# Patient Record
Sex: Female | Born: 1945 | Race: White | Hispanic: No | Marital: Married | Smoking: Never smoker
Health system: Southern US, Community
[De-identification: ages and names within clinical notes are randomized; demographics above are authoritative.]

---

## 2000-05-28 ENCOUNTER — Other Ambulatory Visit: Admission: RE | Admit: 2000-05-28 | Discharge: 2000-05-28 | Payer: Self-pay | Admitting: Obstetrics & Gynecology

## 2000-07-28 ENCOUNTER — Ambulatory Visit (HOSPITAL_COMMUNITY): Admission: RE | Admit: 2000-07-28 | Discharge: 2000-07-28 | Payer: Self-pay | Admitting: Obstetrics & Gynecology

## 2000-08-17 ENCOUNTER — Ambulatory Visit: Admission: RE | Admit: 2000-08-17 | Discharge: 2000-08-17 | Payer: Self-pay | Admitting: Gynecology

## 2000-08-19 ENCOUNTER — Encounter: Payer: Self-pay | Admitting: Obstetrics & Gynecology

## 2000-08-24 ENCOUNTER — Inpatient Hospital Stay (HOSPITAL_COMMUNITY): Admission: RE | Admit: 2000-08-24 | Discharge: 2000-08-27 | Payer: Self-pay | Admitting: Obstetrics & Gynecology

## 2000-09-13 ENCOUNTER — Encounter: Payer: Self-pay | Admitting: Obstetrics & Gynecology

## 2000-09-13 ENCOUNTER — Inpatient Hospital Stay (HOSPITAL_COMMUNITY): Admission: EM | Admit: 2000-09-13 | Discharge: 2000-09-15 | Payer: Self-pay | Admitting: Obstetrics & Gynecology

## 2001-05-06 ENCOUNTER — Other Ambulatory Visit: Admission: RE | Admit: 2001-05-06 | Discharge: 2001-05-06 | Payer: Self-pay | Admitting: Obstetrics & Gynecology

## 2002-05-24 ENCOUNTER — Other Ambulatory Visit: Admission: RE | Admit: 2002-05-24 | Discharge: 2002-05-24 | Payer: Self-pay | Admitting: Obstetrics & Gynecology

## 2003-05-29 ENCOUNTER — Other Ambulatory Visit: Admission: RE | Admit: 2003-05-29 | Discharge: 2003-05-29 | Payer: Self-pay | Admitting: Obstetrics & Gynecology

## 2004-06-03 ENCOUNTER — Ambulatory Visit (HOSPITAL_COMMUNITY): Admission: RE | Admit: 2004-06-03 | Discharge: 2004-06-03 | Payer: Self-pay | Admitting: Dermatology

## 2004-06-13 ENCOUNTER — Other Ambulatory Visit: Admission: RE | Admit: 2004-06-13 | Discharge: 2004-06-13 | Payer: Self-pay | Admitting: Obstetrics & Gynecology

## 2004-07-11 ENCOUNTER — Encounter: Admission: RE | Admit: 2004-07-11 | Discharge: 2004-07-11 | Payer: Self-pay | Admitting: Obstetrics & Gynecology

## 2004-10-13 ENCOUNTER — Ambulatory Visit: Payer: Self-pay

## 2005-07-30 ENCOUNTER — Other Ambulatory Visit: Admission: RE | Admit: 2005-07-30 | Discharge: 2005-07-30 | Payer: Self-pay | Admitting: Obstetrics & Gynecology

## 2005-12-07 IMAGING — CR DG BONE SURVEY MET
8 of 10 series · 8 of 10 positions shown · non-contrast
Comparison: none

CLINICAL DATA: History of Histiocytosis X. Questionable lesions.  
BONE SURVEY:
AP pelvis demonstrates no lytic or obstructive lesions, and no evidence for pathologic fracture.
AP and lateral views of the thoracic and lumbar spine demonstrate thoracolumbar scoliosis with convexity of the scoliosis to the right in the lower thoracic region. There are no fractures or bone lesions.  There are moderate osteophytic changes.
AP and lateral views of the cervical spine demonstrate degenerative disk space narrowing at C5-6 and C6-7 levels.  There are no lytic lesions and there are no fractures or subluxations.  
AP views of the shoulders demonstrate no lytic foci.  There are mild degenerative changes associated with the AC joints bilaterally.  AP views of the right and left femurs and right and left humeri demonstrate no lytic lesions and no pathologic fractures.  A lateral view of the skull is normal.

[view not recorded (1 of 8)]
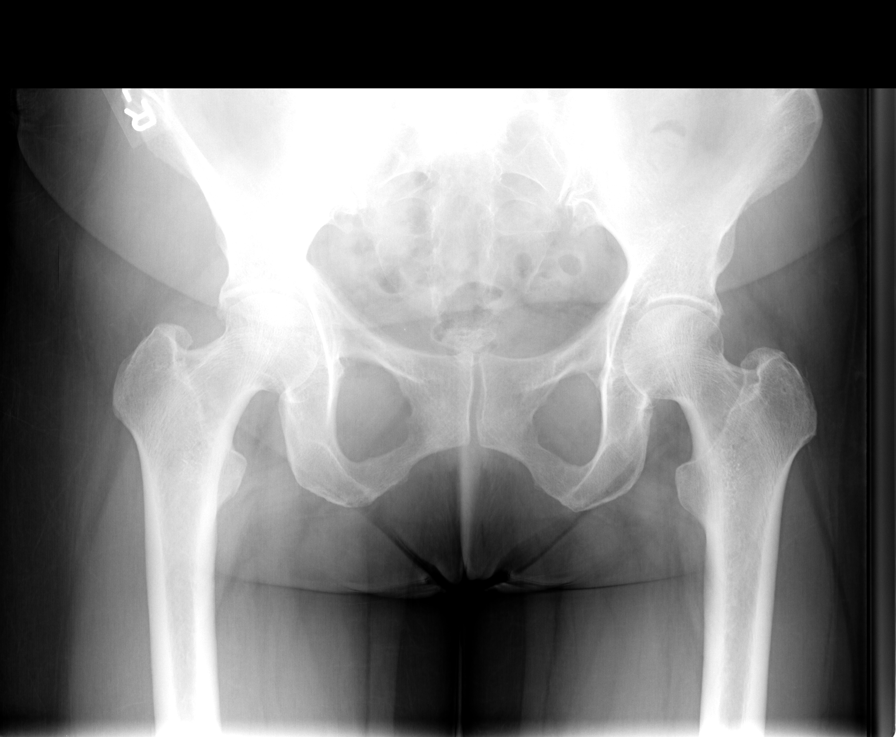

[view not recorded (2 of 8)]
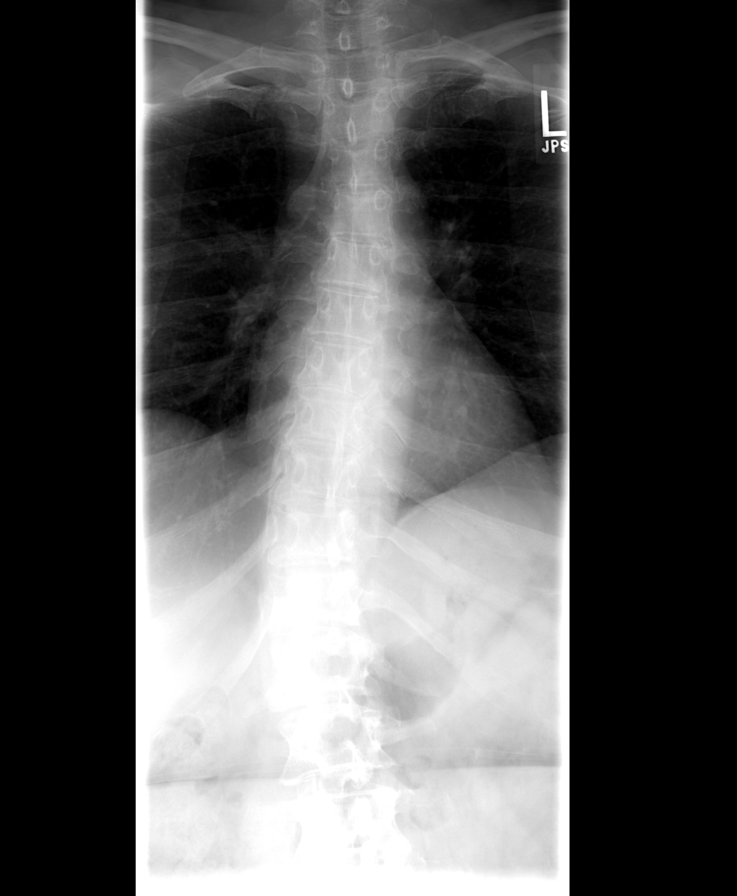

[view not recorded (3 of 8)]
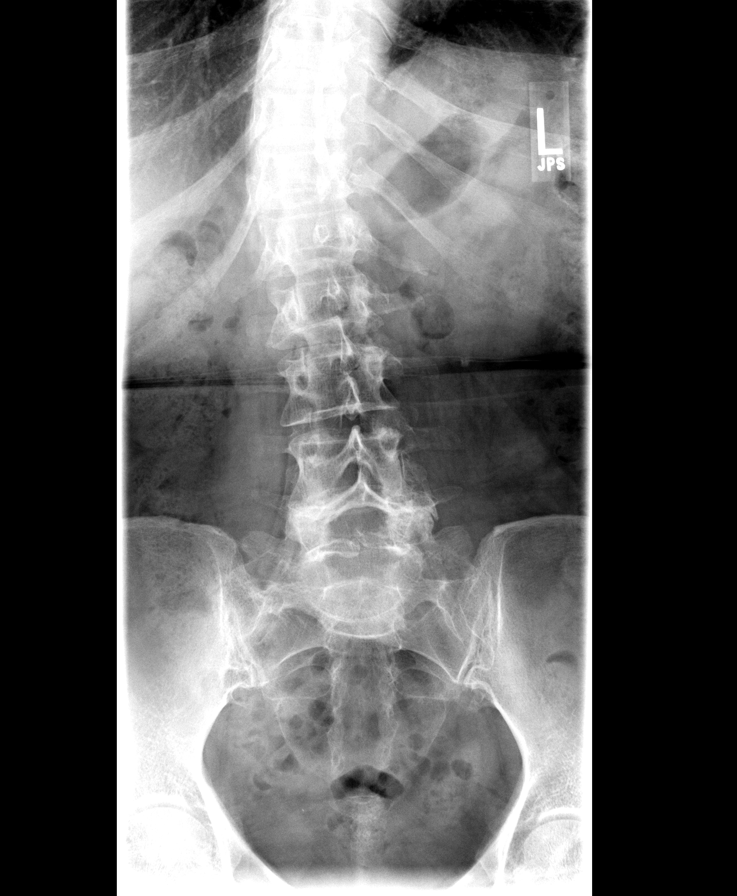

[view not recorded (4 of 8)]
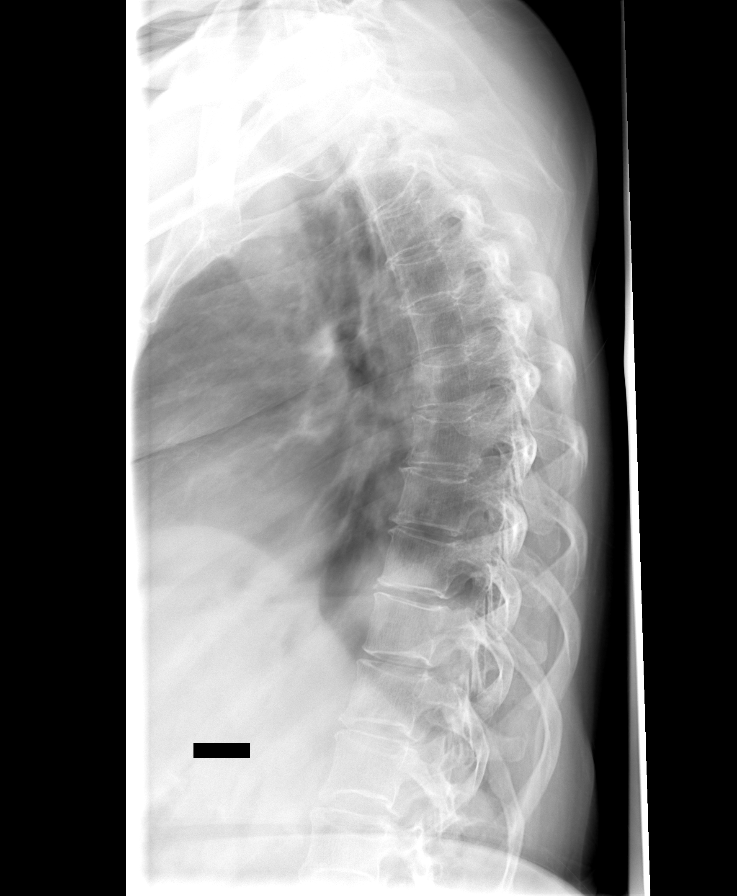

[view not recorded (5 of 8)]
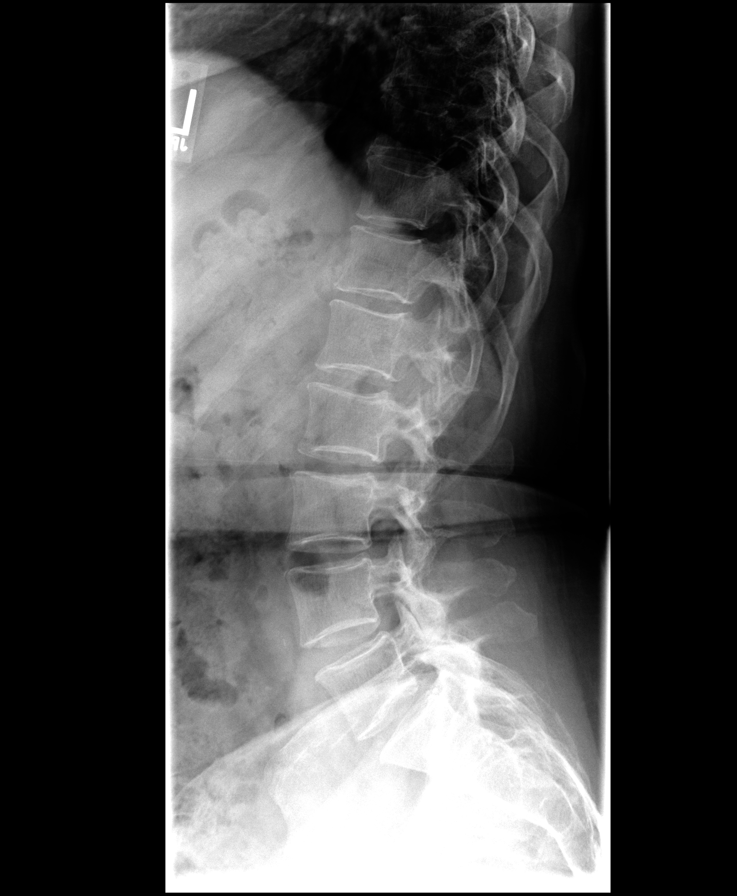

[view not recorded (6 of 8)]
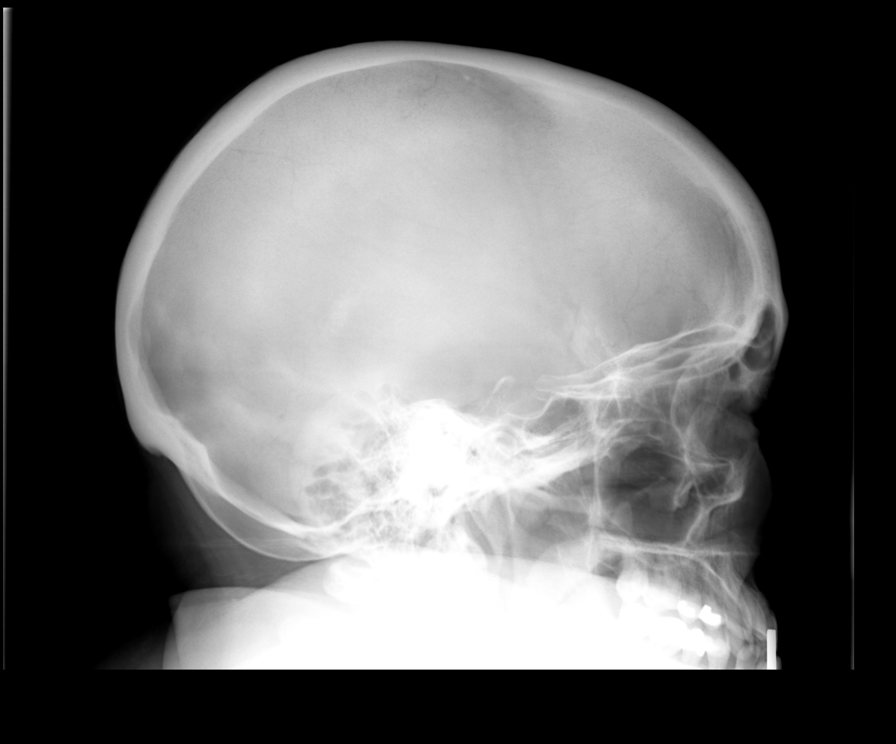

[view not recorded (7 of 8)]
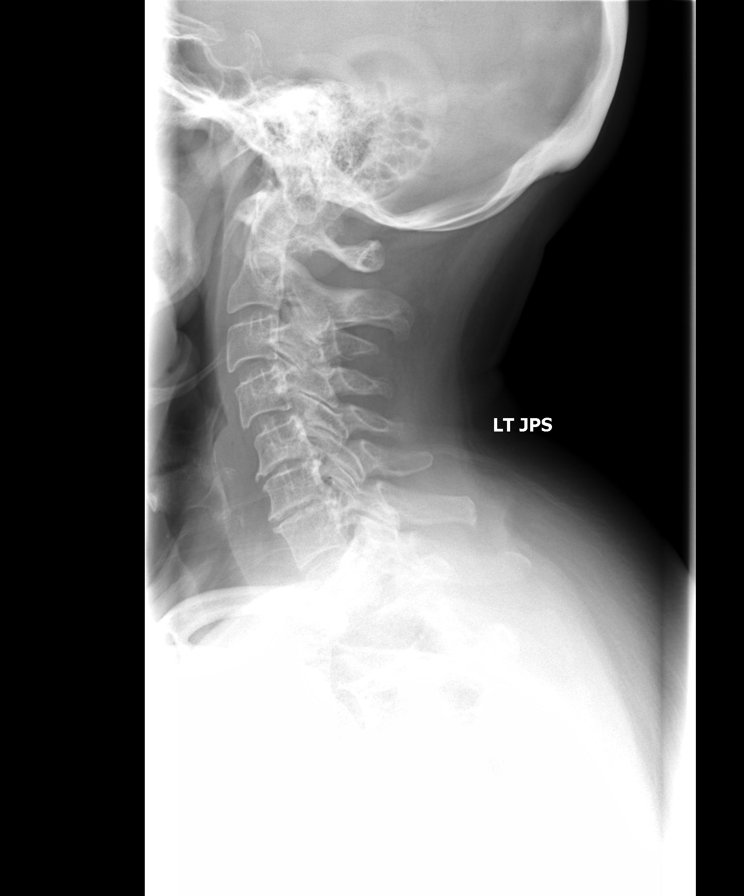

[view not recorded (8 of 8)]
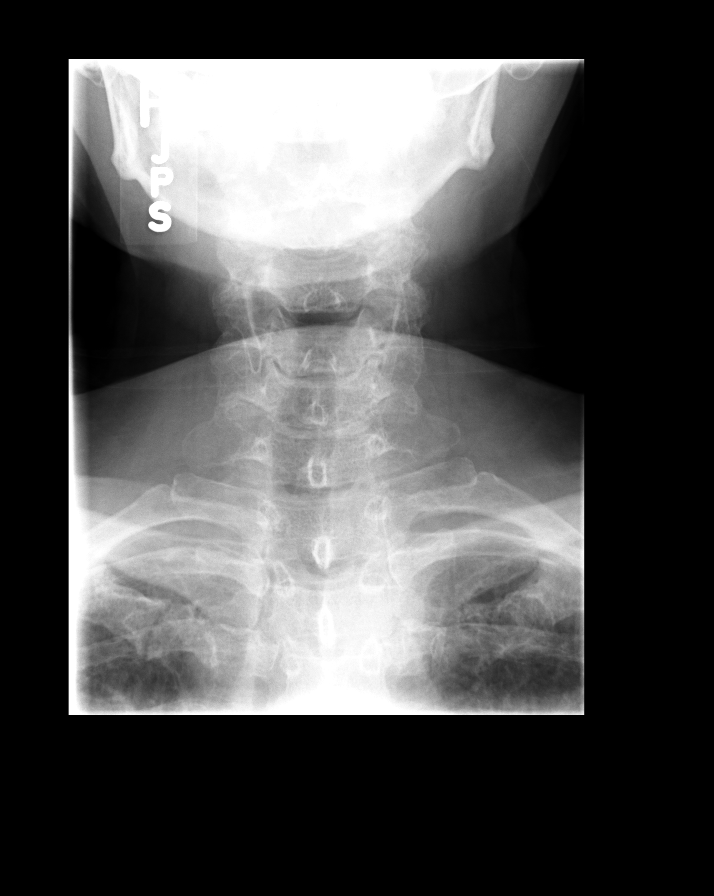

[8 of 10 positions shown; findings below may reference images not displayed]

IMPRESSION: No lytic lesions in this patient with a reported history of Histiocystosis X.  There is thoracolumbar scoliosis noted and degenerative disk space narrowing is noted at the C5-6 and C6-7 levels.

## 2015-02-07 ENCOUNTER — Encounter: Payer: Self-pay | Admitting: Interventional Cardiology

## 2015-02-07 ENCOUNTER — Ambulatory Visit (INDEPENDENT_AMBULATORY_CARE_PROVIDER_SITE_OTHER): Payer: Medicare Other | Admitting: Interventional Cardiology

## 2015-02-07 ENCOUNTER — Ambulatory Visit (INDEPENDENT_AMBULATORY_CARE_PROVIDER_SITE_OTHER): Payer: Medicare Other

## 2015-02-07 VITALS — BP 204/96 | HR 71 | Ht 64.0 in | Wt 176.6 lb

## 2015-02-07 DIAGNOSIS — C55 Malignant neoplasm of uterus, part unspecified: Secondary | ICD-10-CM | POA: Insufficient documentation

## 2015-02-07 DIAGNOSIS — R030 Elevated blood-pressure reading, without diagnosis of hypertension: Secondary | ICD-10-CM

## 2015-02-07 DIAGNOSIS — R002 Palpitations: Secondary | ICD-10-CM | POA: Diagnosis not present

## 2015-02-07 DIAGNOSIS — R519 Headache, unspecified: Secondary | ICD-10-CM

## 2015-02-07 DIAGNOSIS — R232 Flushing: Secondary | ICD-10-CM

## 2015-02-07 DIAGNOSIS — IMO0001 Reserved for inherently not codable concepts without codable children: Secondary | ICD-10-CM

## 2015-02-07 DIAGNOSIS — R51 Headache: Secondary | ICD-10-CM | POA: Diagnosis not present

## 2015-02-07 MED ORDER — AMLODIPINE BESYLATE 2.5 MG PO TABS: 2.5000 mg | ORAL_TABLET | Freq: Every day | ORAL | Status: DC

## 2015-02-07 NOTE — Progress Notes (Signed)
Patient ID: Carrie Rose, female   DOB: 08-06-1945, 69 y.o.   MRN: [id number]     Cardiology Office Note   Date:  02/07/2015   ID:  Carrie, Rose [birth year], MRN [id number]  PCP:  No primary care provider on file.  HTN, ? AFib, flushing/headaches  Chief Complaint  Patient presents with  . Referral    From Dr. Eulogio Ditch Readings from Last 3 Encounters:  02/07/15 176 lb 9.6 oz (80.105 kg)       History of Present Illness: Carrie Rose is a 69 y.o. female  Who has had episodes of headache, flushing, and increased BP readings. When she feels  The flushing, it wil typically be in the evening andBPs can be in the 297L systolic,.  Otherwise, her BPs are in the 120-130 range., They have been getting more frequen tover the past few weeks.  SHe went to her GYN MD and was found to have very high BP readings.  She is sent here for eval.    THese episodes are followed by palpitations.  She currently feels some mild fluttering.  SHe has a  Slight headache.  Anxiety follows and she has had white coat HTN in the past, ever since uterine cancer diagnosed.   She had this type of problem years ago as well,.  It resolved without intervention.    Episodes recently are getting more frequent.    She prefers to avoid prescription medicines.   No past medical history on file.  No past surgical history on file.   Current Outpatient Prescriptions  Medication Sig Dispense Refill  . Ascorbic Acid (VITAMIN C) 1000 MG tablet Take 1,000 mg by mouth daily.    Marland Kitchen CALCIUM PO Take 2 tablets by mouth 2 (two) times daily.    . Cholecalciferol (VITAMIN D3 PO) Take 1 tablet by mouth daily.    . Coenzyme Q10 (CO Q 10 PO) Take 1 capsule by mouth daily.    . Echinacea 380 MG CAPS Take 1 capsule by mouth daily as needed. (congestion)    . fexofenadine (ALLEGRA) 180 MG tablet Take 180 mg by mouth daily.    Marland Kitchen KRILL OIL PO Take 1 capsule by mouth daily.    Marland Kitchen omeprazole (PRILOSEC) 40 MG capsule Take  40 mg by mouth daily as needed. (Pain from Hiatal hernia)    . Red Yeast Rice Extract 600 MG CAPS Take 2 tablets by mouth at bedtime.    . triamcinolone (NASACORT ALLERGY 24HR) 55 MCG/ACT AERO nasal inhaler Place 2 sprays into the nose daily as needed (breathing trouble).    Marland Kitchen amLODipine (NORVASC) 2.5 MG tablet Take 1 tablet (2.5 mg total) by mouth daily. 30 tablet 1   No current facility-administered medications for this visit.    Allergies:   Review of patient's allergies indicates no known allergies.    Social History:  The patient  reports that she has never smoked. She has never used smokeless tobacco. She reports that she does not drink alcohol or use illicit drugs.   Family History:  The patient's family history includes Arthritis in her sister; Breast cancer in her sister; Dementia in her mother; Heart disease in her father; Hypertension in her mother; Macular degeneration in her sister.    ROS:  Please see the history of present illness.   Otherwise, review of systems are positive for headache/flushing as noted above.   All other systems are reviewed and negative.  PHYSICAL EXAM: VS:  BP 204/96 mmHg  Pulse 71  Ht 5\' 4"  (1.626 m)  Wt 176 lb 9.6 oz (80.105 kg)  BMI 30.30 kg/m2 , BMI Body mass index is 30.3 kg/(m^2). GEN: Well nourished, well developed, in no acute distress HEENT: normal Neck: no JVD, carotid bruits, or masses Cardiac: RRR; no murmurs, rubs, or gallops,no edema  Respiratory:  clear to auscultation bilaterally, normal work of breathing GI: soft, nontender, nondistended, + BS MS: no deformity or atrophy Skin: warm and dry, no rash Neuro:  Strength and sensation are intact Psych: euthymic mood, full affect   EKG:   The ekg ordered today demonstrates NSR, no ST segment changes   Recent Labs: No results found for requested labs within last 365 days.   Lipid Panel No results found for: CHOL, TRIG, HDL, CHOLHDL, VLDL, LDLCALC, LDLDIRECT   Other studies  Reviewed: Additional studies/ records that were reviewed today with results demonstrating: abdominal CT scan in 2002.   ASSESSMENT AND PLAN:  1. Elevated blood pressure reading: Start amlodipine 2.5 mg daily. Usually her blood pressures are well controlled. She is having significant spikes. Is difficult to tell whether her headache and flushing are caused by the high blood pressure or if the high blood pressure readings occur after those symptoms start. I've asked her to continue to check her blood pressure at least several times a week, both when she is feeling well as well as when she is feeling poorly. 2. Palpitations: No arrhythmia noted on ECG. We'll plan for 30 day event monitor to evaluate for any arrhythmias. 3. Avoid salt to help keep blood pressure readings low. I did review her abdominal CT scan from 2002. The adrenal glands were normal at that time. No evidence of any type of adenoma or tumor.   Current medicines are reviewed at length with the patient today.  The patient concerns regarding her medicines were addressed.  The following changes have been made:  Add amlodipine  Labs/ tests ordered today include:   Orders Placed This Encounter  Procedures  . Cardiac event monitor  . EKG 12-Lead    Recommend 150 minutes/week of aerobic exercise Low fat, low carb, high fiber diet recommended  Disposition:   FU in 6 weeks   Teresita Madura., MD  02/07/2015 3:19 PM    [facility] [address], [place], [place]  [address] Phone: [phone]; Fax: [phone]

## 2015-02-07 NOTE — Patient Instructions (Signed)
Medication Instructions:  Start taking Amlodipine 2.5 mg daily-all other medications remain the same.  Labwork: None  Testing/Procedures: Your physician has recommended that you wear an event monitor for 30 days. Event monitors are medical devices that record the heart's electrical activity. Doctors most often [place] these monitors to diagnose arrhythmias. Arrhythmias are problems with the speed or rhythm of the heartbeat. The monitor is a small, portable device. You can wear one while you do your normal daily activities. This is usually used to diagnose what is causing palpitations/syncope (passing out).   Follow-Up: 6 weeks.

## 2015-02-26 ENCOUNTER — Telehealth: Payer: Self-pay | Admitting: Interventional Cardiology

## 2015-02-26 NOTE — Telephone Encounter (Signed)
New message   Pt wants rn to call her back  About making room on schedule for pt   This is all that she said

## 2015-02-27 ENCOUNTER — Encounter: Payer: Self-pay | Admitting: Interventional Cardiology

## 2015-02-27 NOTE — Telephone Encounter (Signed)
**Note De-Identified Carrie Rose Obfuscation** The pt had an OV with Dr Irish Lack on 10/13 and was advised to statt taking Amlodipine 2.5 mg daily and to wear an Event monitor for 30 days. An appointment was scheduled for the pt to f/u on 11/29. She called to reschedule her f/u appointment as she has a very important meeting at work on 11/29 and cant keep her appt with Dr Irish Lack.  She wants to know if she can just be contacted with her event monitor results by telephone and, if need be, schedule f/u at that time.  Please advise.

## 2015-02-28 NOTE — Telephone Encounter (Signed)
OK to call results and then schedule f/u prn.

## 2015-03-01 NOTE — Telephone Encounter (Signed)
Monitor BP at home.  If it is consistently over 150/90, she will need BP medication.  At the lowest dose of amlodipine that was prescribed, I doubt that she would have had such severe side effects.  If BP is high, we can try a different medicine.

## 2015-03-01 NOTE — Telephone Encounter (Signed)
The pt is advised that we will contact her with results and if she needs to f/u we will arrange.  While on the phone the pt stated that she is unable to take Amlodipine 2.5 mg daily as advised at her last OV with Dr Irish Lack on 10/13 due to muscle aches in her thighs and itching all over.  She states that she read the side effects of Amlodipine and itching and muscle aches were included.  She reports that she believes she has white coat syndrome as her BP at her OV on 10/13 was 204/96 and that her normal BP is around 130/68. She reports her BP this am is 147/76.  She want to know what Dr Irish Lack wants her to do.

## 2015-03-01 NOTE — Telephone Encounter (Signed)
The pt is advised and she verbalized understanding and agrees with plan.

## 2015-03-26 ENCOUNTER — Ambulatory Visit: Payer: Medicare Other | Admitting: Interventional Cardiology

## 2015-03-27 ENCOUNTER — Telehealth: Payer: Self-pay | Admitting: Interventional Cardiology

## 2015-03-27 NOTE — Telephone Encounter (Signed)
   Did you get the monitor results.?  JV

## 2015-03-28 NOTE — Telephone Encounter (Signed)
**Note De-Identified Carrie Rose Obfuscation** Results are in the pts chart under CV procedures.

## 2015-08-22 ENCOUNTER — Telehealth: Payer: Self-pay | Admitting: Interventional Cardiology

## 2015-08-22 NOTE — Telephone Encounter (Signed)
New Message:  Pt c/o BP issue: STAT if pt c/o blurred vision, one-sided weakness or slurred speech  1. What are your last 5 BP readings? 164/93 and 160/85  2. Are you having any other symptoms (ex. Dizziness, headache, blurred vision, passed out)? N/A  3. What is your BP issue? BP is elevated

## 2015-08-22 NOTE — Telephone Encounter (Signed)
Pt states she does check her BP regularly, she is at work and does not have the log of readings with her. Pt states in the last 2 weeks she feels her BP has been consistently been 150/80 or higher, heart rate 70-76. Pt states she is not currently taking amlodipine, she only took it for a few days after office visit with Dr Irish Lack last fall.  Pt states she stopped amlodipine because she had some itchiness but she also has skin disorder that causes itchiness.  Pt states she is willing to try amlodipine again.  Pt advised I will forward to Dr Irish Lack for review.

## 2015-08-22 NOTE — Telephone Encounter (Signed)
Pt states these BP readings are from yesterday evening, she has not checked her BP today.

## 2015-08-23 MED ORDER — AMLODIPINE BESYLATE 5 MG PO TABS: 5.0000 mg | ORAL_TABLET | Freq: Every day | ORAL | Status: AC

## 2015-08-23 NOTE — Telephone Encounter (Signed)
The pt is advised and she wants to know if she can just take Amlodipine 5 mg as needed for flushing and elevated BP as she states that her BP is not always elevated and that sometimes she goes for months without an elevated BP. Please advise.

## 2015-08-23 NOTE — Telephone Encounter (Signed)
OK to start amlodipine 5 mg daily. 

## 2015-08-26 NOTE — Telephone Encounter (Signed)
OK to try amlodipine 5 mg daily as needed for now.  If BP is consistently high, could try amlodipine 2.5 mg daily.

## 2015-08-28 NOTE — Telephone Encounter (Signed)
**Note De-identified [name] Obfuscation** The pt is advised and she verbalized understanding. 

## 2019-04-30 ENCOUNTER — Emergency Department (INDEPENDENT_AMBULATORY_CARE_PROVIDER_SITE_OTHER)
Admission: EM | Admit: 2019-04-30 | Discharge: 2019-04-30 | Disposition: A | Discharge status: Home / Self Care | Payer: Medicare Other | Source: Home / Self Care | Attending: Family Medicine | Admitting: Family Medicine

## 2019-04-30 ENCOUNTER — Other Ambulatory Visit: Payer: Self-pay

## 2019-04-30 DIAGNOSIS — Z20822 Contact with and (suspected) exposure to covid-19: Secondary | ICD-10-CM | POA: Diagnosis not present

## 2019-04-30 NOTE — ED Triage Notes (Signed)
Granddaughter tested pos but has not seen her for 7 days. No current sxs. But slight itching in RT ear, hx of allergies so not abnormal for her.

## 2019-04-30 NOTE — ED Provider Notes (Signed)
[facility]    CSN: [id number] Arrival date & time: 04/30/19  [id number]      History   Chief Complaint Chief Complaint  Patient presents with  . covid test    HPI Artavia Loh is a 74 y.o. female.   Patient presents for COVID19 testing.  She is completely assymptomatic at present.  The history is provided by the patient.    History reviewed. No pertinent past medical history.  Patient Active Problem List   Diagnosis Date Noted  . Uterine cancer ([place]) 02/07/2015    History reviewed. No pertinent surgical history.  OB History   No obstetric history on file.      Home Medications    Prior to Admission medications   Medication Sig Start Date End Date Taking? Authorizing Provider  chlorthalidone (HYGROTON) 25 MG tablet Take 25 mg by mouth daily.   Yes [provider]  amLODipine (NORVASC) 5 MG tablet Take 1 tablet (5 mg total) by mouth daily. 08/23/15   Jettie Booze, MD  Ascorbic Acid (VITAMIN C) 1000 MG tablet Take 1,000 mg by mouth daily.    [provider]  CALCIUM PO Take 2 tablets by mouth 2 (two) times daily.    [provider]  Cholecalciferol (VITAMIN D3 PO) Take 1 tablet by mouth daily.    [provider]  Coenzyme Q10 (CO Q 10 PO) Take 1 capsule by mouth daily.    [provider]  Echinacea 380 MG CAPS Take 1 capsule by mouth daily as needed. (congestion)    [provider]  fexofenadine (ALLEGRA) 180 MG tablet Take 180 mg by mouth daily.    [provider]  KRILL OIL PO Take 1 capsule by mouth daily.    [provider]  omeprazole (PRILOSEC) 40 MG capsule Take 40 mg by mouth daily as needed. (Pain from Hiatal hernia)    [provider]  Red Yeast Rice Extract 600 MG CAPS Take 2 tablets by mouth at bedtime.    [provider]  triamcinolone (NASACORT ALLERGY 24HR) 55 MCG/ACT AERO nasal inhaler Place 2 sprays into the nose daily as needed (breathing  trouble).    [provider]    Family History Family History  Problem Relation Age of Onset  . Hypertension Mother   . Dementia Mother   . Heart disease Father   . Arthritis Sister   . Breast cancer Sister   . Macular degeneration Sister     Social History Social History   Tobacco Use  . Smoking status: Never Smoker  . Smokeless tobacco: Never Used  Substance Use Topics  . Alcohol use: No    Alcohol/week: 0.0 standard drinks  . Drug use: No     Allergies   Patient has no known allergies.   Review of Systems Review of Systems No sore throat No cough No pleuritic pain No wheezing No nasal congestion No post-nasal drainage No sinus pain/pressure No itchy/red eyes No earache No hemoptysis No SOB No fever/chills No nausea No vomiting No abdominal pain No diarrhea No urinary symptoms No skin rash No fatigue No myalgias No headache   Physical Exam Triage Vital Signs ED Triage Vitals [04/30/19 0843]  Enc Vitals Group     BP (!) 141/81     Pulse Rate 77     Resp 16     Temp 98.6 F (37 C)     Temp Source Oral     SpO2 96 %  Weight 173 lb (78.5 kg)     Height 4\' 11"  (1.499 m)     Head Circumference      Peak Flow      Pain Score 0     Pain Loc      Pain Edu?      Excl. in [place]?    No data found.  Updated Vital Signs BP (!) 141/81 (BP Location: Right Arm)   Pulse 77   Temp 98.6 F (37 C) (Oral)   Resp 16   Ht 4\' 11"  (1.499 m)   Wt 78.5 kg   SpO2 96%   BMI 34.94 kg/m   Visual Acuity Right Eye Distance:   Left Eye Distance:   Bilateral Distance:    Right Eye Near:   Left Eye Near:    Bilateral Near:     Physical Exam Vitals and nursing note reviewed.  Constitutional:      General: She is not in acute distress. Neurological:     Mental Status: She is alert.   Patient not examined otherwise   UC Treatments / Results  Labs (all labs ordered are listed, but only abnormal results are displayed) Labs Reviewed    NOVEL CORONAVIRUS, NAA    EKG   Radiology No results found.  Procedures Procedures (including critical care time)  Medications Ordered in UC Medications - No data to display  Initial Impression / Assessment and Plan / UC Course  I have reviewed the triage vital signs and the nursing notes.  Pertinent labs & imaging results that were available during my care of the patient were reviewed by me and considered in my medical decision making (see chart for details).    Patient assymptomatic at present  [place] send out   Final Clinical Impressions(s) / UC Diagnoses   Final diagnoses:  Exposure to COVID-19 virus     Discharge Instructions     Isolate yourself until COVID-19 test result is available.   If your COVID19 test is positive, then you are infected with the novel coronavirus and could give the virus to others.  Please continue isolation at home for at least 10 days since the start of your symptoms. If you do not have symptoms, please isolate at home for 10 days from the day you were tested. Once you complete your 10 day quarantine, you may return to normal activities as long as you've not had a fever for over 24 hours (without taking fever reducing medicine) and your symptoms are improving. Please continue good preventive care measures, including:  frequent hand-washing, avoid touching your face, cover coughs/sneezes, stay out of crowds and keep a 6 foot distance from others.  Go to the nearest hospital emergency room if fever/cough/breathlessness are severe or illness seems like a threat to life.   Increase Vitamin D3 to 5000 units daily, and Vitamin C 500mg  twice daily.     ED Prescriptions    None        Kandra Nicolas, MD 04/30/19 [phone]

## 2019-04-30 NOTE — Discharge Instructions (Addendum)
Isolate yourself until COVID-19 test result is available.   If your COVID19 test is positive, then you are infected with the novel coronavirus and could give the virus to others.  Please continue isolation at home for at least 10 days since the start of your symptoms. If you do not have symptoms, please isolate at home for 10 days from the day you were tested. Once you complete your 10 day quarantine, you may return to normal activities as long as you've not had a fever for over 24 hours (without taking fever reducing medicine) and your symptoms are improving. Please continue good preventive care measures, including:  frequent hand-washing, avoid touching your face, cover coughs/sneezes, stay out of crowds and keep a 6 foot distance from others.  Go to the nearest hospital emergency room if fever/cough/breathlessness are severe or illness seems like a threat to life.   Increase Vitamin D3 to 5000 units daily, and Vitamin C 500mg  twice daily.

## 2019-05-01 LAB — NOVEL CORONAVIRUS, NAA: SARS-CoV-2, NAA: NOT DETECTED

## 2019-06-29 ENCOUNTER — Other Ambulatory Visit: Payer: Self-pay

## 2019-06-29 ENCOUNTER — Emergency Department (INDEPENDENT_AMBULATORY_CARE_PROVIDER_SITE_OTHER)
Admission: EM | Admit: 2019-06-29 | Discharge: 2019-06-29 | Disposition: A | Discharge status: Home / Self Care | Payer: Medicare Other | Source: Home / Self Care

## 2019-06-29 ENCOUNTER — Emergency Department (INDEPENDENT_AMBULATORY_CARE_PROVIDER_SITE_OTHER): Payer: Medicare Other

## 2019-06-29 DIAGNOSIS — R0602 Shortness of breath: Secondary | ICD-10-CM

## 2019-06-29 DIAGNOSIS — J9811 Atelectasis: Secondary | ICD-10-CM

## 2019-06-29 LAB — POCT CBC W AUTO DIFF (K'VILLE URGENT CARE)

## 2019-06-29 MED ORDER — AMOXICILLIN 875 MG PO TABS: 875.0000 mg | ORAL_TABLET | Freq: Two times a day (BID) | ORAL | 0 refills | Status: AC

## 2019-06-29 NOTE — Discharge Instructions (Addendum)
Take plain guaifenesin (1200mg  extended release tabs such as Mucinex) twice daily, with plenty of water, for cough and congestion. Get adequate rest.    Also recommend using saline nasal spray several times daily and saline nasal irrigation (AYR is a common brand).  Use Flonase nasal spray each morning after using saline nasal irrigation. Try warm salt water gargles for sore throat.  Stop all antihistamines for now, and other non-prescription cough/cold preparations. May take Delsym Cough Suppressant at bedtime for nighttime cough.

## 2019-06-29 NOTE — ED Triage Notes (Addendum)
Patient presents to Urgent Care with complaints of shortness of breath and chest tightness with deep inspiration during ambulation or exertion for the past several weeks. Patient reports her PCP does not have any openings until tomorrow and they recommended she come here for a chest x-ray. Patient states she has had multiple covid tests and workups with diagnoses of asthma. Patient requesting rapid covid test, last test was in january of this year. Patient in NAD during triage, speaking in full sentences, no signs of dyspnea.

## 2019-06-29 NOTE — ED Provider Notes (Signed)
[facility]    CSN: [id number] Arrival date & time: 06/29/19  1103      History   Chief Complaint Chief Complaint  Patient presents with  . Shortness of Breath    HPI Carrie Rose is a 74 y.o. female.   Patient became fatigued yesterday with tightness in her chest, sensation of shortness of breath, and increased sinus pressure.  She has become more easily winded with activity during the past several weeks.  She denies fevers, chills, and sweats.  She has had several negative COVID19 tests (last one in January).  She denies changes in taste/smell.  Her PCP recommended that she proceed to an urgent care for evaluation  The history is provided by the patient.    Past medical history:  Uterine Cancer.  Patient Active Problem List   Diagnosis Date Noted  . Uterine cancer ([place]) 02/07/2015    History reviewed. No pertinent surgical history.     Home Medications    Prior to Admission medications   Medication Sig Start Date End Date Taking? Authorizing Provider  atenolol (TENORMIN) 25 MG tablet Take by mouth.   Yes [provider]  chlorthalidone (HYGROTON) 25 MG tablet Take 25 mg by mouth daily.   Yes [provider]  potassium chloride SA (KLOR-CON) 20 MEQ tablet Take 20 mEq by mouth 2 (two) times daily. 05/31/19  Yes [provider]  amLODipine (NORVASC) 5 MG tablet Take 1 tablet (5 mg total) by mouth daily. 08/23/15   Jettie Booze, MD  amoxicillin (AMOXIL) 875 MG tablet Take 1 tablet (875 mg total) by mouth 2 (two) times daily. 06/29/19   Kandra Nicolas, MD  Ascorbic Acid (VITAMIN C) 1000 MG tablet Take 1,000 mg by mouth daily.    [provider]  CALCIUM PO Take 2 tablets by mouth 2 (two) times daily.    [provider]  Cholecalciferol (VITAMIN D3 PO) Take 1 tablet by mouth daily.    [provider]  Coenzyme Q10 (CO Q 10 PO) Take 1 capsule by mouth daily.    [provider]  Echinacea 380  MG CAPS Take 1 capsule by mouth daily as needed. (congestion)    [provider]  fexofenadine (ALLEGRA) 180 MG tablet Take 180 mg by mouth daily.    [provider]  KRILL OIL PO Take 1 capsule by mouth daily.    [provider]  omeprazole (PRILOSEC) 40 MG capsule Take 40 mg by mouth daily as needed. (Pain from Hiatal hernia)    [provider]  Red Yeast Rice Extract 600 MG CAPS Take 2 tablets by mouth at bedtime.    [provider]  triamcinolone (NASACORT ALLERGY 24HR) 55 MCG/ACT AERO nasal inhaler Place 2 sprays into the nose daily as needed (breathing trouble).    [provider]    Family History Family History  Problem Relation Age of Onset  . Hypertension Mother   . Dementia Mother   . Heart disease Father   . Arthritis Sister   . Breast cancer Sister   . Macular degeneration Sister     Social History Social History   Tobacco Use  . Smoking status: Never Smoker  . Smokeless tobacco: Never Used  Substance Use Topics  . Alcohol use: No    Alcohol/week: 0.0 standard drinks  . Drug use: No     Allergies   Patient has no known allergies.   Review of Systems Review of Systems  No sore throat ? cough No pleuritic pain but has tightness in anterior chest No wheezing + nasal congestion No post-nasal drainage + sinus pain/pressure No itchy/red eyes No earache No hemoptysis + SOB No fever/chills No nausea No vomiting No abdominal pain No diarrhea No urinary symptoms No skin rash + fatigue No myalgias + headache    Physical Exam Triage Vital Signs ED Triage Vitals  Enc Vitals Group     BP 06/29/19 1119 140/84     Pulse Rate 06/29/19 1119 65     Resp 06/29/19 1119 17     Temp 06/29/19 1119 98.3 F (36.8 C)     Temp Source 06/29/19 1119 Oral     SpO2 06/29/19 1119 97 %     Weight 06/29/19 1116 173 lb (78.5 kg)     Height 06/29/19 1116 4\' 11"  (1.499 m)     Head Circumference --      Peak Flow --       Pain Score 06/29/19 1116 0     Pain Loc --      Pain Edu? --      Excl. in [place]? --    No data found.  Updated Vital Signs BP 140/84 (BP Location: Left Arm)   Pulse 65   Temp 98.3 F (36.8 C) (Oral)   Resp 17   Ht 4\' 11"  (1.499 m)   Wt 78.5 kg   SpO2 97%   BMI 34.94 kg/m   Visual Acuity Right Eye Distance:   Left Eye Distance:   Bilateral Distance:    Right Eye Near:   Left Eye Near:    Bilateral Near:     Physical Exam Nursing notes and Vital Signs reviewed. Appearance:  Patient appears stated age, and in no acute distress Eyes:  Pupils are equal, round, and reactive to light and accomodation.  Extraocular movement is intact.  Conjunctivae are not inflamed  Ears:  Canals normal.  Tympanic membranes normal.  Nose:  Mildly congested turbinates.  No sinus tenderness. Pharynx:  Normal Neck:  Supple.  Mildly enlarged lateral nodes are present, tender to palpation on the left.   Lungs:  Clear to auscultation.  Breath sounds are equal.  Moving air well. Heart:  Regular rate and rhythm without murmurs, rubs, or gallops.  Abdomen:  Nontender without masses or hepatosplenomegaly.  Bowel sounds are present.  No CVA or flank tenderness.  Extremities:  No edema.  Skin:  No rash present.   UC Treatments / Results  Labs (all labs ordered are listed, but only abnormal results are displayed) Labs Reviewed  NOVEL CORONAVIRUS, NAA  POCT CBC W AUTO DIFF ([facility]):  WBC 10.2; LY 16.0; MO 2.1; GR 81.9; Hgb 14.4; Platelets 307     EKG  Rate:  64 BPM PR:  172 msec QT:  414 msec QTcH:  427 msec QRSD:  90 msec QRS axis:  28 degrees Interpretation:  Low voltage QRS; normal sinus rhythm.  No acute changes   Radiology DG Chest 2 View  Result Date: 06/29/2019 CLINICAL DATA:  Increased dyspnea on exertion and anterior chest tightness EXAM: CHEST - 2 VIEW COMPARISON:  None FINDINGS: Upper normal heart size. Mediastinal contours and pulmonary vascularity normal.  Eventrations of the diaphragms bilaterally. Minimal subsegmental atelectasis at LEFT base. Remaining lungs clear. No pleural effusion or pneumothorax. Bones demineralized with dextroconvex thoracic scoliosis. IMPRESSION: Minimal LEFT basilar atelectasis. Electronically Signed   By: Lavonia Dana M.D.   On: 06/29/2019 12:59  Procedures Procedures (including critical care time)  Medications Ordered in UC Medications - No data to display  Initial Impression / Assessment and Plan / UC Course  I have reviewed the triage vital signs and the nursing notes.  Pertinent labs & imaging results that were available during my care of the patient were reviewed by me and considered in my medical decision making (see chart for details).    No acute changes on EKG today reassuring. Review of previous chest X-ray done by [facility] 05/12/18 revealed "no acute cardiopulmonary abnormality." Suspect viral URI.  Because of atelectasis left base on today's chest x-ray not previously present, will begin empiric amoxicillin. COVID19 test pending. Followup with [facility] if not improved in one week.    Final Clinical Impressions(s) / UC Diagnoses   Final diagnoses:  Shortness of breath     Discharge Instructions     Take plain guaifenesin (1200mg  extended release tabs such as Mucinex) twice daily, with plenty of water, for cough and congestion. Get adequate rest.    Also recommend using saline nasal spray several times daily and saline nasal irrigation (AYR is a common brand).  Use Flonase nasal spray each morning after using saline nasal irrigation. Try warm salt water gargles for sore throat.  Stop all antihistamines for now, and other non-prescription cough/cold preparations. May take Delsym Cough Suppressant at bedtime for nighttime cough.     ED Prescriptions    Medication Sig Dispense Auth. Provider   amoxicillin (AMOXIL) 875 MG tablet Take 1 tablet (875 mg total) by mouth 2 (two) times daily. 14  tablet Kandra Nicolas, MD        Kandra Nicolas, MD 07/03/19 [phone]

## 2019-07-02 LAB — NOVEL CORONAVIRUS, NAA: SARS-CoV-2, NAA: NOT DETECTED

## 2021-01-01 IMAGING — DX DG CHEST 2V
2 series · 2 of 2 positions shown · non-contrast
Comparison: None

CLINICAL DATA: Increased dyspnea on exertion and anterior chest
tightness

EXAM:
CHEST - 2 VIEW

[chest pa]
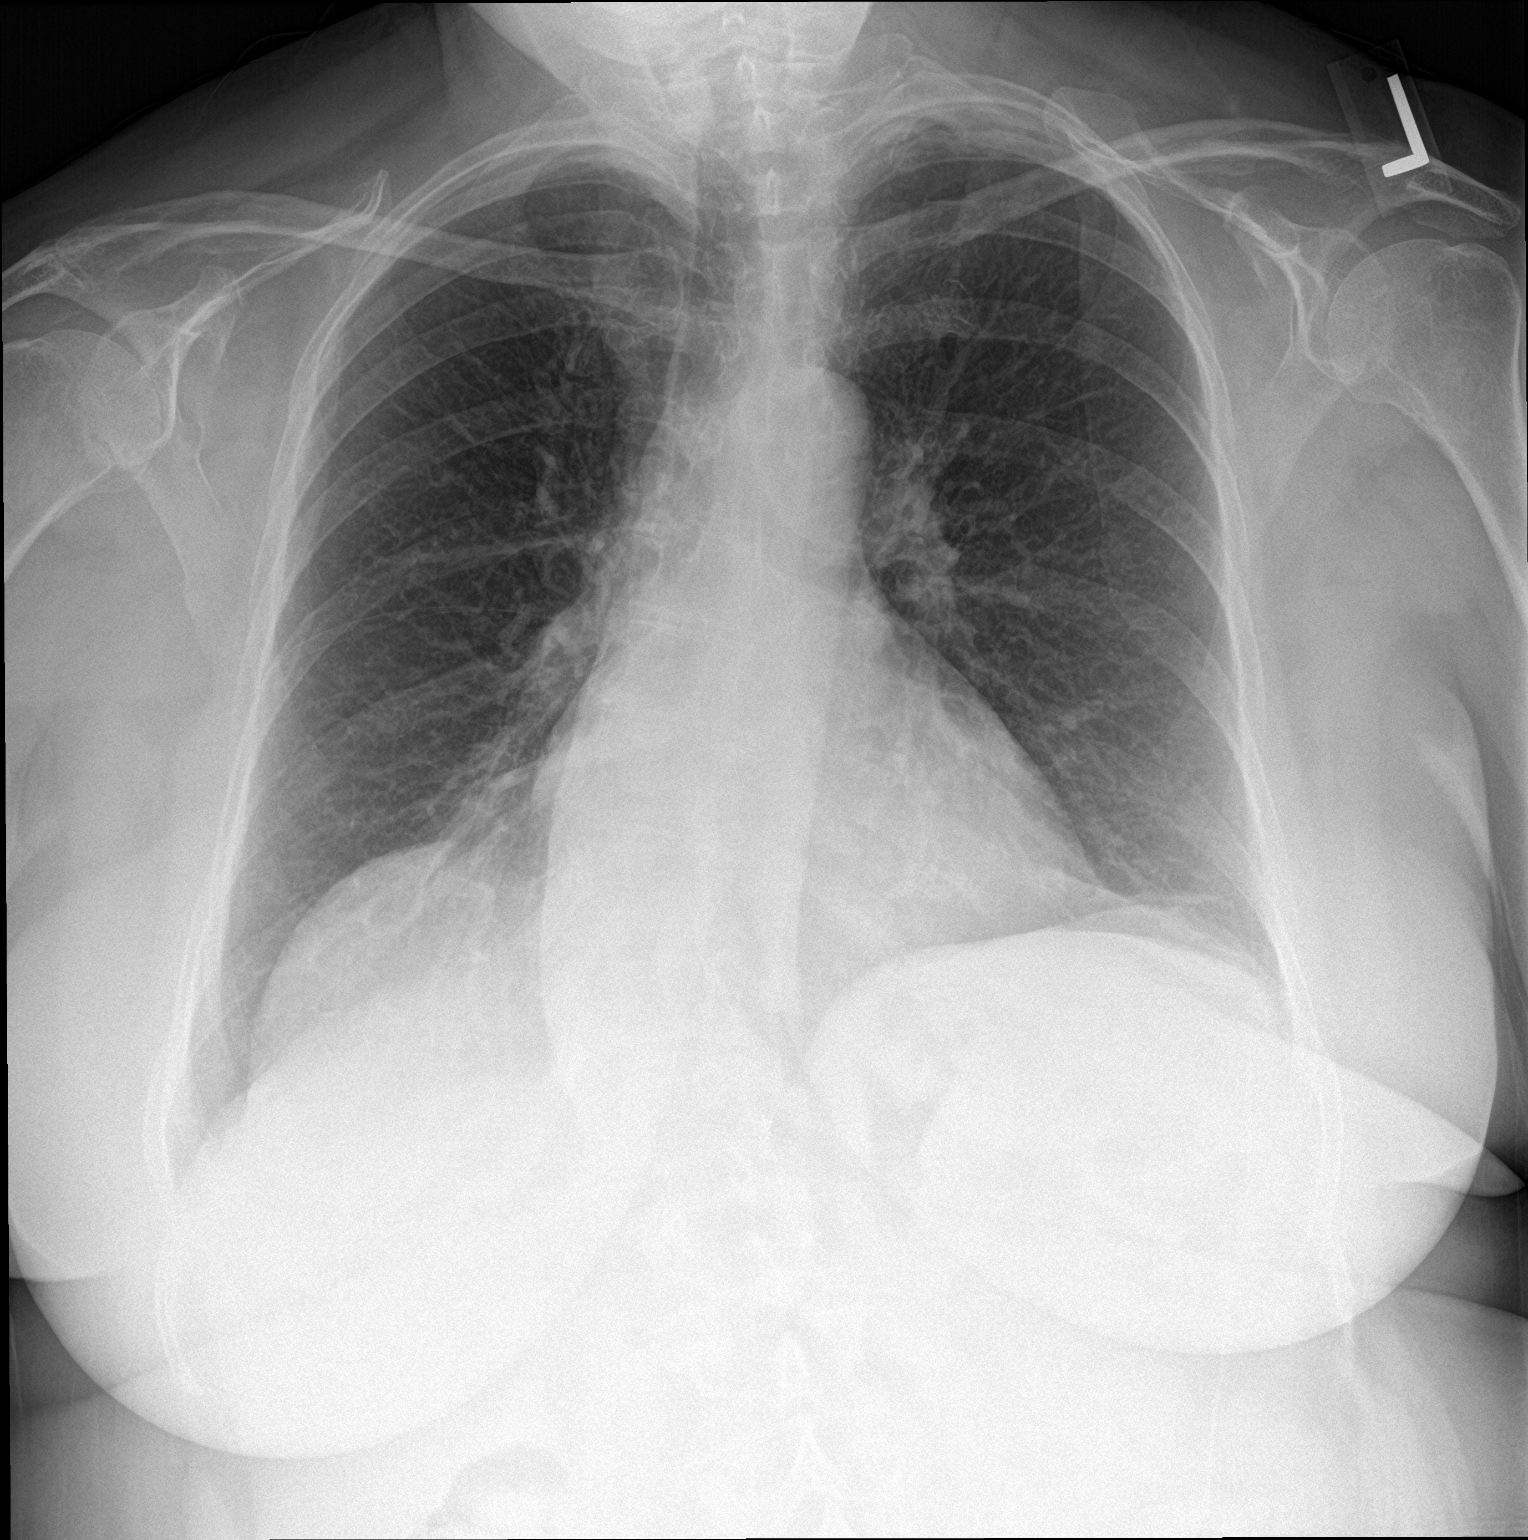

[chest lat]
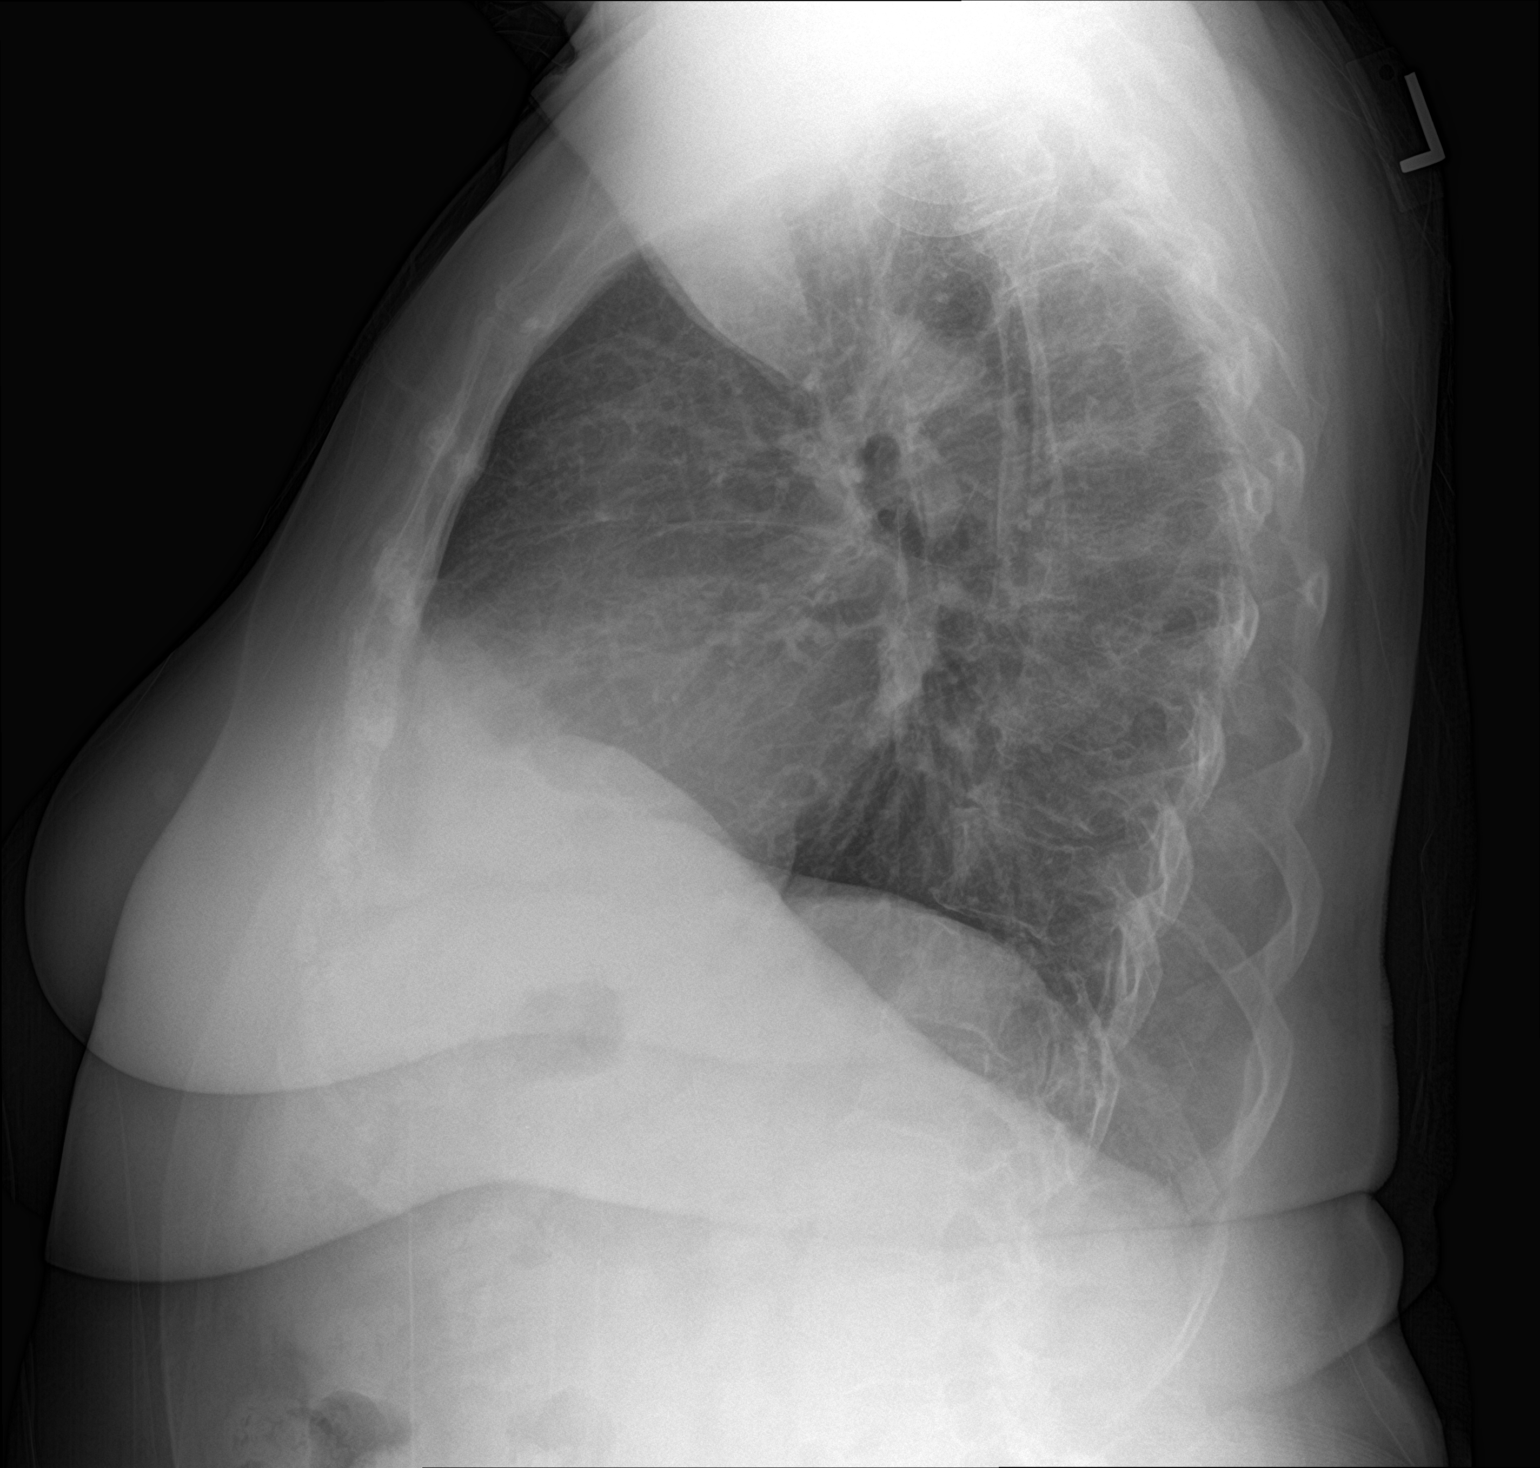

[2 of 2 positions shown; findings below may reference images not displayed]

FINDINGS: Upper normal heart size.

Mediastinal contours and pulmonary vascularity normal.

Eventrations of the diaphragms bilaterally.

Minimal subsegmental atelectasis at LEFT base.

Remaining lungs clear.

No pleural effusion or pneumothorax.

Bones demineralized with dextroconvex thoracic scoliosis.
IMPRESSION: Minimal LEFT basilar atelectasis.
